# Patient Record
Sex: Male | Born: 1995 | Race: Black or African American | Hispanic: No | Marital: Single | Smoking: Current every day smoker
Health system: Southern US, Community
[De-identification: ages and names within clinical notes are randomized; demographics above are authoritative.]

---

## 2014-05-26 ENCOUNTER — Encounter (HOSPITAL_BASED_OUTPATIENT_CLINIC_OR_DEPARTMENT_OTHER): Payer: Self-pay

## 2014-05-26 ENCOUNTER — Emergency Department (HOSPITAL_BASED_OUTPATIENT_CLINIC_OR_DEPARTMENT_OTHER)
Admission: EM | Admit: 2014-05-26 | Discharge: 2014-05-26 | Disposition: A | Discharge status: Home / Self Care | Payer: Medicaid Other | Attending: Emergency Medicine | Admitting: Emergency Medicine

## 2014-05-26 DIAGNOSIS — S199XXA Unspecified injury of neck, initial encounter: Secondary | ICD-10-CM | POA: Diagnosis not present

## 2014-05-26 DIAGNOSIS — Y998 Other external cause status: Secondary | ICD-10-CM | POA: Insufficient documentation

## 2014-05-26 DIAGNOSIS — Y9389 Activity, other specified: Secondary | ICD-10-CM | POA: Diagnosis not present

## 2014-05-26 DIAGNOSIS — Y92410 Unspecified street and highway as the place of occurrence of the external cause: Secondary | ICD-10-CM | POA: Insufficient documentation

## 2014-05-26 DIAGNOSIS — Z720 Tobacco use: Secondary | ICD-10-CM | POA: Diagnosis not present

## 2014-05-26 NOTE — Discharge Instructions (Signed)
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
## 2015-04-03 ENCOUNTER — Emergency Department (HOSPITAL_BASED_OUTPATIENT_CLINIC_OR_DEPARTMENT_OTHER)
Admission: EM | Admit: 2015-04-03 | Discharge: 2015-04-03 | Disposition: A | Discharge status: Home / Self Care | Payer: No Typology Code available for payment source | Attending: Emergency Medicine | Admitting: Emergency Medicine

## 2015-04-03 ENCOUNTER — Emergency Department (HOSPITAL_BASED_OUTPATIENT_CLINIC_OR_DEPARTMENT_OTHER): Payer: No Typology Code available for payment source

## 2015-04-03 ENCOUNTER — Encounter (HOSPITAL_BASED_OUTPATIENT_CLINIC_OR_DEPARTMENT_OTHER): Payer: Self-pay | Admitting: *Deleted

## 2015-04-03 DIAGNOSIS — S59902A Unspecified injury of left elbow, initial encounter: Secondary | ICD-10-CM | POA: Diagnosis not present

## 2015-04-03 DIAGNOSIS — Y998 Other external cause status: Secondary | ICD-10-CM | POA: Diagnosis not present

## 2015-04-03 DIAGNOSIS — F17200 Nicotine dependence, unspecified, uncomplicated: Secondary | ICD-10-CM | POA: Diagnosis not present

## 2015-04-03 DIAGNOSIS — S8992XA Unspecified injury of left lower leg, initial encounter: Secondary | ICD-10-CM | POA: Diagnosis not present

## 2015-04-03 DIAGNOSIS — Y9389 Activity, other specified: Secondary | ICD-10-CM | POA: Diagnosis not present

## 2015-04-03 DIAGNOSIS — Y92410 Unspecified street and highway as the place of occurrence of the external cause: Secondary | ICD-10-CM | POA: Diagnosis not present

## 2015-04-03 MED ORDER — IBUPROFEN 800 MG PO TABS
800.0000 mg | ORAL_TABLET | Freq: Once | ORAL | Status: AC
  Administered 2015-04-03: 800 mg via ORAL
  Filled 2015-04-03: qty 1

## 2015-04-03 MED ORDER — IBUPROFEN 800 MG PO TABS: 800.0000 mg | ORAL_TABLET | Freq: Three times a day (TID) | ORAL | Status: DC
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
## 2017-08-08 ENCOUNTER — Other Ambulatory Visit: Payer: Self-pay

## 2017-08-08 ENCOUNTER — Emergency Department (HOSPITAL_BASED_OUTPATIENT_CLINIC_OR_DEPARTMENT_OTHER)
Admission: EM | Admit: 2017-08-08 | Discharge: 2017-08-08 | Disposition: A | Discharge status: Home / Self Care | Payer: Self-pay | Attending: Emergency Medicine | Admitting: Emergency Medicine

## 2017-08-08 ENCOUNTER — Encounter (HOSPITAL_BASED_OUTPATIENT_CLINIC_OR_DEPARTMENT_OTHER): Payer: Self-pay | Admitting: Emergency Medicine

## 2017-08-08 DIAGNOSIS — F17200 Nicotine dependence, unspecified, uncomplicated: Secondary | ICD-10-CM | POA: Insufficient documentation

## 2017-08-08 DIAGNOSIS — J029 Acute pharyngitis, unspecified: Secondary | ICD-10-CM

## 2017-08-08 DIAGNOSIS — B9789 Other viral agents as the cause of diseases classified elsewhere: Secondary | ICD-10-CM | POA: Insufficient documentation

## 2017-08-08 LAB — RAPID STREP SCREEN (MED CTR MEBANE ONLY): Streptococcus, Group A Screen (Direct): NEGATIVE

## 2017-08-08 MED ORDER — IBUPROFEN 800 MG PO TABS: 800.0000 mg | ORAL_TABLET | Freq: Three times a day (TID) | ORAL | 0 refills | Status: AC | PRN

## 2017-08-08 NOTE — Discharge Instructions (Signed)

## 2017-08-08 NOTE — ED Provider Notes (Signed)
Emergency Department Provider Note   I have reviewed the triage vital signs and the nursing notes.   HISTORY  Chief Complaint Sore Throat   HPI Zachary Paul is a 22 y.o. male presents to the ED with sore throat for the last 3 days. No fever, chills. Some pain with swallowing but is able to do so. No SOB. Hasn't tried OTC medication. No modifying factors. No radiation of symptoms. Pain is sharp and moderately severe.   History reviewed. No pertinent past medical history.  There are no active problems to display for this patient.   History reviewed. No pertinent surgical history.  Current Outpatient Rx  . Order #: [id number] Class: Print    Allergies Patient has no known allergies.  No family history on file.  Social History Social History   Tobacco Use  . Smoking status: Current Every Day Smoker  . Smokeless tobacco: Never Used  Substance Use Topics  . Alcohol use: Yes  . Drug use: Not on file    Review of Systems  Constitutional: No fever/chills Eyes: No visual changes. ENT: Positive sore throat. Cardiovascular: Denies chest pain. Respiratory: Denies shortness of breath. Gastrointestinal: No abdominal pain.  No nausea, no vomiting.  No diarrhea.  No constipation. Genitourinary: Negative for dysuria. Musculoskeletal: Negative for back pain. Skin: Negative for rash. Neurological: Negative for headaches, focal weakness or numbness.  10-point ROS otherwise negative.  ____________________________________________   PHYSICAL EXAM:  VITAL SIGNS: ED Triage Vitals  Enc Vitals Group     BP 08/08/17 1805 (!) 159/89     Pulse Rate 08/08/17 1805 82     Resp 08/08/17 1805 18     Temp 08/08/17 1805 99 F (37.2 C)     Temp Source 08/08/17 1805 Oral     SpO2 08/08/17 1805 97 %     Weight 08/08/17 1804 132 lb (59.9 kg)     Height 08/08/17 1804 5\' 11"  (1.803 m)     Pain Score 08/08/17 1804 9   Constitutional: Alert and oriented. Well appearing and in no acute  distress. Eyes: Conjunctivae are normal.  Head: Atraumatic. Nose: No congestion/rhinnorhea. Mouth/Throat: Mucous membranes are moist.  Oropharynx with diffuse erythema. No exudate or PTA. Clear voice. Managing oral secretions.  Neck: No stridor.  Neurologic:  Normal speech and language.  Skin:  Skin is warm, dry and intact. No rash noted.  ____________________________________________   LABS (all labs ordered are listed, but only abnormal results are displayed)  Labs Reviewed  RAPID STREP SCREEN (NOT AT [facility])  CULTURE, GROUP A STREP [facility])   ____________________________________________   PROCEDURES  Procedure(s) performed:   Procedures  None ____________________________________________   INITIAL IMPRESSION / ASSESSMENT AND PLAN / ED COURSE  Pertinent labs & imaging results that were available during my care of the patient were reviewed by me and considered in my medical decision making (see chart for details).  Patient presents to the ED with viral pharyngitis. Negative strep. No PTA. Plan for supportive care and PCP follow up.   At this time, I do not feel there is any life-threatening condition present. I have reviewed and discussed all results (EKG, imaging, lab, urine as appropriate), exam findings with patient. I have reviewed nursing notes and appropriate previous records.  I feel the patient is safe to be discharged home without further emergent workup. Discussed usual and customary return precautions. Patient and family (if present) verbalize understanding and are comfortable with this plan.  Patient will follow-up with their primary care  provider. If they do not have a primary care provider, information for follow-up has been provided to them. All questions have been answered.  ____________________________________________  FINAL CLINICAL IMPRESSION(S) / ED DIAGNOSES  Final diagnoses:  Viral pharyngitis    Note:  This document was prepared using Dragon voice  recognition software and may include unintentional dictation errors.  Alona BeneJoshua Antwyne Pingree, MD Emergency Medicine    Esaias Cleavenger, Arlyss RepressJoshua G, MD 08/09/17 1146

## 2017-08-08 NOTE — ED Triage Notes (Signed)
Sore throat x 3 days

## 2017-08-11 LAB — CULTURE, GROUP A STREP (THRC)

## 2023-06-09 ENCOUNTER — Encounter (HOSPITAL_BASED_OUTPATIENT_CLINIC_OR_DEPARTMENT_OTHER): Payer: Self-pay

## 2023-06-09 ENCOUNTER — Emergency Department (HOSPITAL_BASED_OUTPATIENT_CLINIC_OR_DEPARTMENT_OTHER)
Admission: EM | Admit: 2023-06-09 | Discharge: 2023-06-09 | Disposition: A | Discharge status: Home / Self Care | Payer: Medicaid Other | Attending: Emergency Medicine | Admitting: Emergency Medicine

## 2023-06-09 DIAGNOSIS — X58XXXD Exposure to other specified factors, subsequent encounter: Secondary | ICD-10-CM | POA: Diagnosis not present

## 2023-06-09 DIAGNOSIS — Z4802 Encounter for removal of sutures: Secondary | ICD-10-CM | POA: Diagnosis not present

## 2023-06-09 DIAGNOSIS — S61211D Laceration without foreign body of left index finger without damage to nail, subsequent encounter: Secondary | ICD-10-CM | POA: Diagnosis present

## 2023-06-09 MED ORDER — BACITRACIN ZINC 500 UNIT/GM EX OINT
TOPICAL_OINTMENT | Freq: Once | CUTANEOUS | Status: AC
  Administered 2023-06-09: 31.5 via TOPICAL
  Filled 2023-06-09: qty 28.35

## 2023-06-09 NOTE — ED Notes (Signed)
Wound care provided to left index finger.

## 2023-06-09 NOTE — Discharge Instructions (Signed)
Remove the splint and perform wound care with mild soap and water daily.  Keep the area protected.  Return to the emergency department sooner if you have worsening pain, redness, swelling around the fingertip, pus draining from the fingertip, or new concerns.  Otherwise, you should return in 3 to 5 days for suture removal at that time.

## 2023-06-09 NOTE — ED Triage Notes (Signed)
Pt here for removal of stitiches to left index finger. Cut with a knife. Stitches have been in 10 days

## 2023-06-09 NOTE — ED Provider Notes (Signed)
[facility] Provider Note   CSN: [id number] Arrival date & time: 06/09/23  0102     History  Chief Complaint  Patient presents with   Suture / Staple Removal    Zachary Paul is a 28 y.o. male.  Patient presents to the emergency department today for suture removal.  Patient sustained a knife cut to his left index fingertip 10 days ago.  He states that he was seen at [facility] urgent [facility] and had stitches applied.  Patient arrives with 11 sutures in place.  He kept the bandage on for about a week and then removed it 3 days ago.  He states that he has a burning pain in the fingertip at times.  He hit it on a garbage can 2 days ago and it hurt for several hours.  No significant drainage.       Home Medications Prior to Admission medications   Medication Sig Start Date End Date Taking? Authorizing Provider  ibuprofen (ADVIL,MOTRIN) 800 MG tablet Take 1 tablet (800 mg total) by mouth every 8 (eight) hours as needed. 08/08/17   Long, Arlyss Repress, MD      Allergies    Patient has no known allergies.    Review of Systems   Review of Systems  Physical Exam Updated Vital Signs BP (!) 148/92   Pulse 66   Temp 98.1 F (36.7 C) (Oral)   Resp 16   Wt 63.5 kg   SpO2 100%   BMI 19.53 kg/m  Physical Exam Vitals and nursing note reviewed.  Constitutional:      Appearance: He is well-developed.  HENT:     Head: Normocephalic and atraumatic.  Eyes:     Conjunctiva/sclera: Conjunctivae normal.  Pulmonary:     Effort: No respiratory distress.  Musculoskeletal:     Cervical back: Normal range of motion and neck supple.     Comments: Approximately 2 cm fingertip laceration noted with nylon sutures in place.  The skin tissue ulnarly appears to be devitalized.  The wound in this area, while approximated, is not well adhered.  This area will likely slough off and heal secondarily.  Wound appears clean without signs of infection.  Skin:     General: Skin is warm and dry.  Neurological:     Mental Status: He is alert.     ED Results / Procedures / Treatments   Labs (all labs ordered are listed, but only abnormal results are displayed) Labs Reviewed - No data to display  EKG None  Radiology No results found.  Procedures Suture Removal  Date/Time: 06/09/2023 10:09 AM  Performed by: Renne Crigler, PA-C Authorized by: Renne Crigler, PA-C   Consent:    Consent obtained:  Verbal   Consent given by:  Patient   Risks discussed:  Bleeding, pain and wound separation Universal protocol:    Patient identity confirmed:  Verbally with patient and provided demographic data Location:    Location:  Upper extremity   Upper extremity location:  Hand   Hand location:  L index finger Procedure details:    Wound appearance:  No signs of infection   Number of sutures removed:  5 Post-procedure details:    Post-removal:  Antibiotic ointment applied and dressing applied   Procedure completion:  Tolerated well, no immediate complications Comments:     I was able to remove 5 of 11 stitches on the radial aspect of the fingertip.  The wound is healing well in  this area.  Due to the tissue devitalization ulnarly, this area is not well adhered and removal of sutures would likely allow this area to dehisce.  Plan is to leave the stitches in this area and for another 3 to 5 days.  This will more time for the underlying tissue to develop and fill in.  Advised suture removal at that time.     Medications Ordered in ED Medications  bacitracin ointment (31.5 Applications Topical Given 06/09/23 1000)    ED Course/ Medical Decision Making/ A&P   {    Patient seen and examined. History obtained directly from patient. Work-up including labs, imaging, EKG ordered in triage, if performed, were reviewed.    Labs/EKG: None ordered  Imaging: None ordered  Medications/Fluids: Ordered: Bacitracin  Most recent vital signs reviewed and are  as follows: BP (!) 148/92   Pulse 66   Temp 98.1 F (36.7 C) (Oral)   Resp 16   Wt 63.5 kg   SpO2 100%   BMI 19.53 kg/m   Initial impression: Encounter for suture removal and wound recheck  Home treatment plan: Wound care  Return instructions discussed with patient: Pt urged to return with worsening pain, worsening swelling, expanding area of redness or streaking up extremity, fever, or any other concerns.  Pt verbalizes understanding and agrees with plan.  Follow-up instructions discussed with patient: Return in 3-5 days for recheck of wound and likely removal of remainder of stitches.                                Medical Decision Making Risk OTC drugs.   Partial suture removal as discussed above.  The ulnar portion of the wound is not healing as well.  There appears to be some devitalized tissue.  Would like to keep stitches in place for couple more days to allow the underlying tissue to more fully develop.  Stitches can likely be removed at that time.        Final Clinical Impression(s) / ED Diagnoses Final diagnoses:  Encounter for removal of sutures    Rx / DC Orders ED Discharge Orders     None         Renne Crigler, PA-C 06/09/23 1013    Virgina Norfolk, DO 06/09/23 1110

## 2023-06-22 ENCOUNTER — Emergency Department (HOSPITAL_BASED_OUTPATIENT_CLINIC_OR_DEPARTMENT_OTHER)
Admission: EM | Admit: 2023-06-22 | Discharge: 2023-06-22 | Disposition: A | Discharge status: Home / Self Care | Payer: Medicaid Other | Attending: Emergency Medicine | Admitting: Emergency Medicine

## 2023-06-22 ENCOUNTER — Encounter (HOSPITAL_BASED_OUTPATIENT_CLINIC_OR_DEPARTMENT_OTHER): Payer: Self-pay | Admitting: Urology

## 2023-06-22 DIAGNOSIS — Z4802 Encounter for removal of sutures: Secondary | ICD-10-CM | POA: Diagnosis present

## 2023-06-22 NOTE — ED Provider Notes (Signed)
[place] [facility] Provider Note   CSN: [id number] Arrival date & time: 06/22/23  4098     History  Chief Complaint  Patient presents with   Suture / Staple Removal    Zachary Paul is a 28 y.o. male.  Patient presents to the emergency department for suture removal, left pointer finger.  I saw the patient on 06/09/2023.  At that time we were able to remove 8 of the 13 sutures however 5 were left in place as the skin was not yet well-healed.  Patient was instructed to return in 3 to 5 days.  He presents today for removal of the remaining sutures.  Reports that pain is improved.  Sometimes he uses his hand and forgets that he had an injury.  No worsening swelling or redness.       Home Medications Prior to Admission medications   Medication Sig Start Date End Date Taking? Authorizing Provider  ibuprofen (ADVIL,MOTRIN) 800 MG tablet Take 1 tablet (800 mg total) by mouth every 8 (eight) hours as needed. 08/08/17   Long, Arlyss Repress, MD      Allergies    Patient has no known allergies.    Review of Systems   Review of Systems  Physical Exam Updated Vital Signs BP 121/80   Pulse 63   Temp 98 F (36.7 C)   Resp 18   Ht 5\' 11"  (1.803 m)   Wt 63.5 kg   SpO2 100%   BMI 19.52 kg/m  Physical Exam Vitals and nursing note reviewed.  Constitutional:      Appearance: He is well-developed.  HENT:     Head: Normocephalic and atraumatic.  Eyes:     Conjunctiva/sclera: Conjunctivae normal.  Pulmonary:     Effort: No respiratory distress.  Musculoskeletal:     Cervical back: Normal range of motion and neck supple.     Comments: Patient can flex left index finger at PIP, DIP and MCP joints.  Skin:    General: Skin is warm and dry.     Comments: Left index finger: Interval complete closure of the wound.  There is a bit of scabbing.  5 sutures remained intact.  Wound is dry.  The tissues of the fingertip are hard.  Small focal area of dark  discoloration of the fingertip.  Neurological:     Mental Status: He is alert.     ED Results / Procedures / Treatments   Labs (all labs ordered are listed, but only abnormal results are displayed) Labs Reviewed - No data to display  EKG None  Radiology No results found.  Procedures Suture Removal  Date/Time: 06/22/2023 10:44 AM  Performed by: Renne Crigler, PA-C Authorized by: Renne Crigler, PA-C   Consent:    Consent obtained:  Verbal   Consent given by:  Patient   Risks discussed:  Pain, wound separation and bleeding Universal protocol:    Patient identity confirmed:  Verbally with patient and provided demographic data Location:    Location:  Upper extremity   Upper extremity location:  Hand   Hand location:  L index finger Procedure details:    Wound appearance:  No signs of infection and nontender   Number of sutures removed:  5 Post-procedure details:    Post-removal:  No dressing applied   Procedure completion:  Tolerated well, no immediate complications     Medications Ordered in ED Medications - No data to display  ED Course/ Medical Decision Making/ A&P  Patient seen and examined. History obtained directly from patient. Work-up including labs, imaging, EKG ordered in triage, if performed, were reviewed.    Labs/EKG: None ordered  Imaging: None ordered  Medications/Fluids: None ordered  Most recent vital signs reviewed and are as follows: BP 121/80   Pulse 63   Temp 98 F (36.7 C)   Resp 18   Ht 5\' 11"  (1.803 m)   Wt 63.5 kg   SpO2 100%   BMI 19.52 kg/m   Initial impression: Wound healing, suture removal  Home treatment plan: Continue wound care  Return instructions discussed with patient: Signs of infection, increased redness, swelling, purulent drainage  Follow-up instructions discussed with patient: PCP as needed                                Medical Decision Making  Patient here for suture removal.  I saw patient before.   Wound well-closed today.  Sutures removed without complication.  As the fingertip is healing, the tissues on the fingertip are very hard.  Discussed with patient that we will likely be weeks to months to completely remodel.  Discussed that he may have scar tissue or sensation issue.  Fortunately, no pain currently.  We discussed continued wound care and protection of area.        Final Clinical Impression(s) / ED Diagnoses Final diagnoses:  Visit for suture removal    Rx / DC Orders ED Discharge Orders     None         Renne Crigler, PA-C 06/22/23 1047    Virgina Norfolk, DO 06/22/23 1103

## 2023-06-22 NOTE — ED Triage Notes (Signed)
Here for suture removal to left pointer finger , been placed for 28 days  Was here on 1/21 and told to return in 5 days, unable to get here until now

## 2023-06-22 NOTE — ED Notes (Signed)
Wound care provided to patients left index finger. Antibiotic ointment and bandaid applied.

## 2023-06-22 NOTE — Discharge Instructions (Signed)
Continue to apply antibiotic ointment and keep the finger covered and protected if risk of contamination, while it continues to heal.  Return with any worsening pain, redness, swelling, pus draining or other concerns.
